# Patient Record
Sex: Female | Born: 1975 | Race: Black or African American | Hispanic: No | Marital: Single | Smoking: Current every day smoker
Health system: Southern US, Community
[De-identification: ages and names within clinical notes are randomized; demographics above are authoritative.]

---

## 1997-12-30 ENCOUNTER — Other Ambulatory Visit: Admission: RE | Admit: 1997-12-30 | Discharge: 1997-12-30 | Payer: Self-pay | Admitting: Obstetrics and Gynecology

## 1998-02-05 ENCOUNTER — Emergency Department (HOSPITAL_COMMUNITY): Admission: EM | Admit: 1998-02-05 | Discharge: 1998-02-06 | Payer: Self-pay | Admitting: Emergency Medicine

## 1998-03-10 ENCOUNTER — Other Ambulatory Visit: Admission: RE | Admit: 1998-03-10 | Discharge: 1998-03-10 | Payer: Self-pay

## 1998-12-24 ENCOUNTER — Emergency Department (HOSPITAL_COMMUNITY): Admission: EM | Admit: 1998-12-24 | Discharge: 1998-12-24 | Payer: Self-pay | Admitting: Emergency Medicine

## 1998-12-25 ENCOUNTER — Ambulatory Visit (HOSPITAL_COMMUNITY): Admission: RE | Admit: 1998-12-25 | Discharge: 1998-12-25 | Payer: Self-pay | Admitting: Ophthalmology

## 2002-04-08 ENCOUNTER — Emergency Department (HOSPITAL_COMMUNITY): Admission: EM | Admit: 2002-04-08 | Discharge: 2002-04-09 | Payer: Self-pay | Admitting: Emergency Medicine

## 2005-01-31 ENCOUNTER — Emergency Department (HOSPITAL_COMMUNITY): Admission: EM | Admit: 2005-01-31 | Discharge: 2005-01-31 | Payer: Self-pay | Admitting: Family Medicine

## 2005-10-09 ENCOUNTER — Emergency Department (HOSPITAL_COMMUNITY): Admission: EM | Admit: 2005-10-09 | Discharge: 2005-10-09 | Payer: Self-pay | Admitting: Family Medicine

## 2006-01-20 ENCOUNTER — Emergency Department (HOSPITAL_COMMUNITY): Admission: EM | Admit: 2006-01-20 | Discharge: 2006-01-21 | Payer: Self-pay | Admitting: Emergency Medicine

## 2007-06-03 ENCOUNTER — Emergency Department (HOSPITAL_COMMUNITY): Admission: EM | Admit: 2007-06-03 | Discharge: 2007-06-03 | Payer: Self-pay | Admitting: Emergency Medicine

## 2007-10-16 ENCOUNTER — Emergency Department (HOSPITAL_COMMUNITY): Admission: EM | Admit: 2007-10-16 | Discharge: 2007-10-16 | Payer: Self-pay | Admitting: Emergency Medicine

## 2008-04-07 ENCOUNTER — Ambulatory Visit (HOSPITAL_COMMUNITY): Admission: RE | Admit: 2008-04-07 | Discharge: 2008-04-07 | Payer: Self-pay | Admitting: *Deleted

## 2008-12-03 IMAGING — US US ABDOMEN COMPLETE
1 series · 14 of 25 positions shown · non-contrast
Comparison: No comparison.

CLINICAL DATA: Upper abdominal pain, nausea, and vomiting. 
 ABDOMEN ULTRASOUND:
TECHNIQUE: Complete abdominal ultrasound examination was performed including evaluation of the liver, gallbladder, bile ducts, pancreas, kidneys, spleen, IVC, and abdominal aorta.

[Series 1: unknown · 0.38mm/px · 14 of 53 slices shown]
[im 1/53]
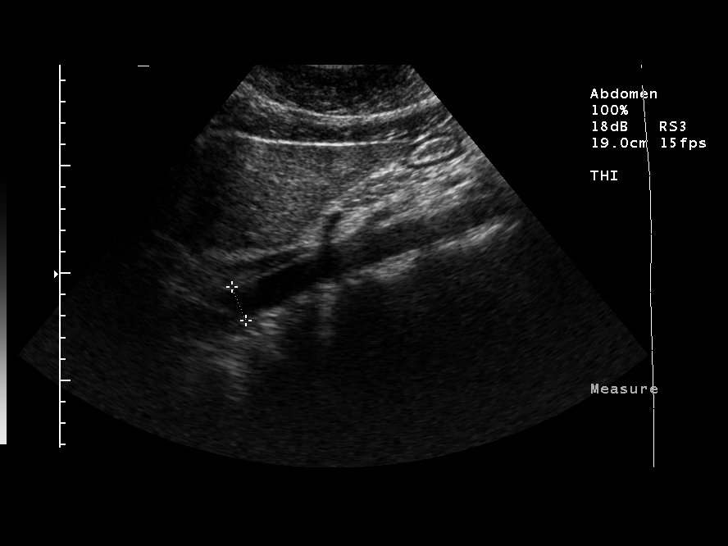
[im 5/53]
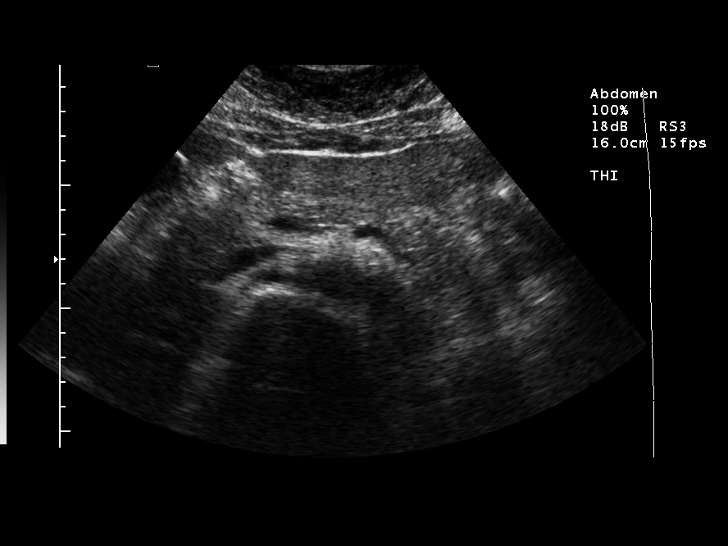
[im 9/53]
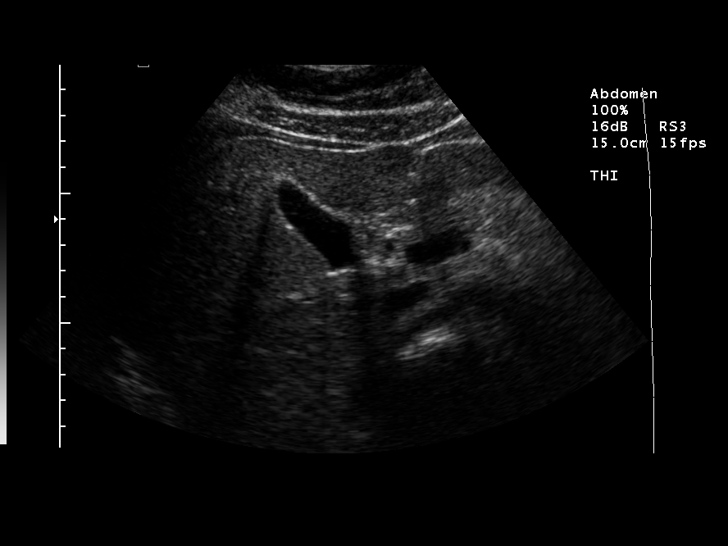
[im 14/53]
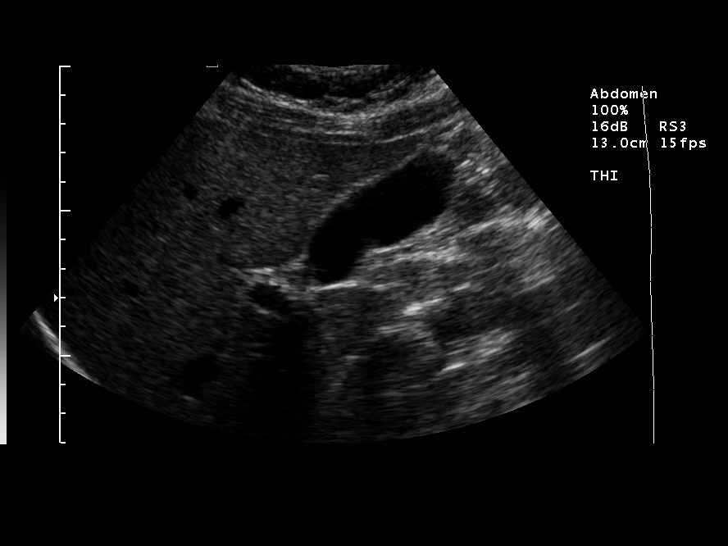
[im 18/53]
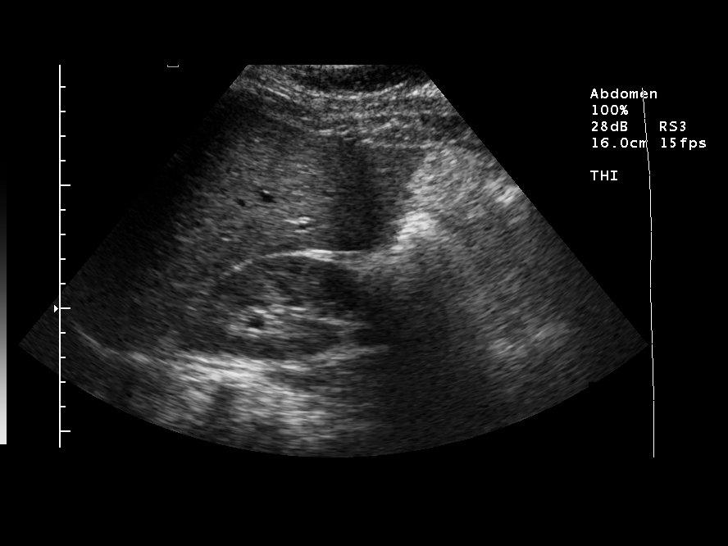
[im 20/53]
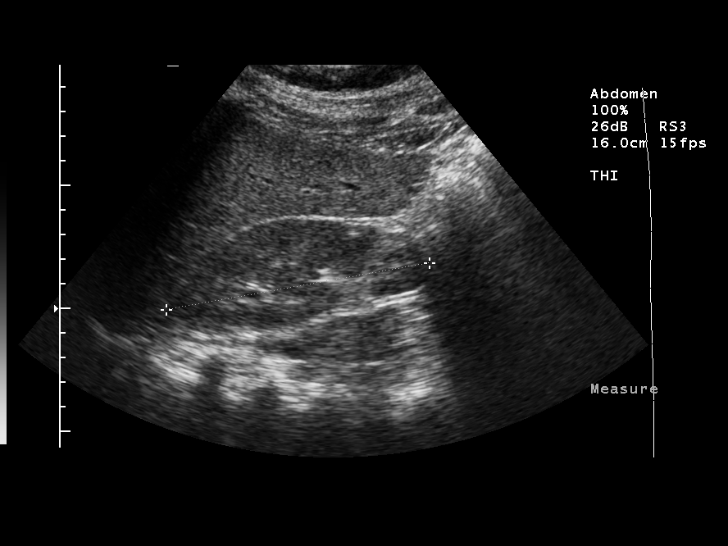
[im 24/53]
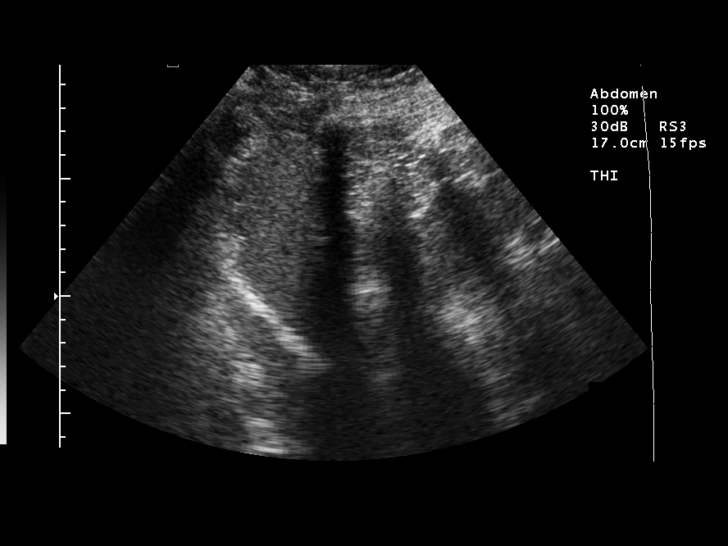
[im 29/53]
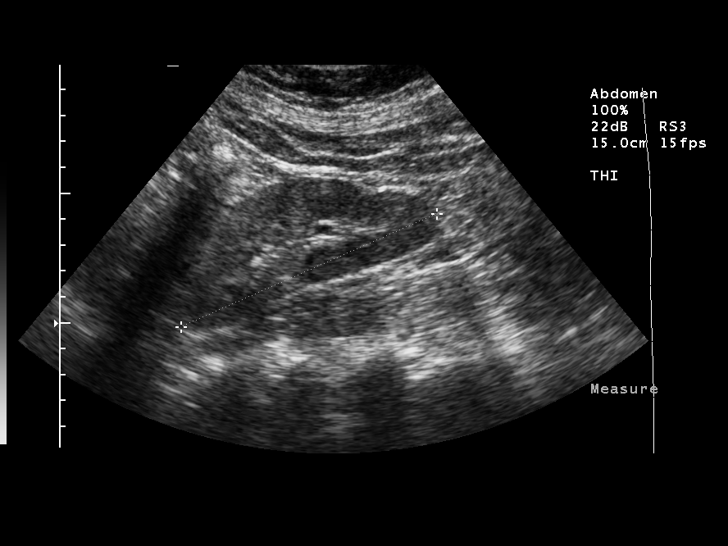
[im 33/53]
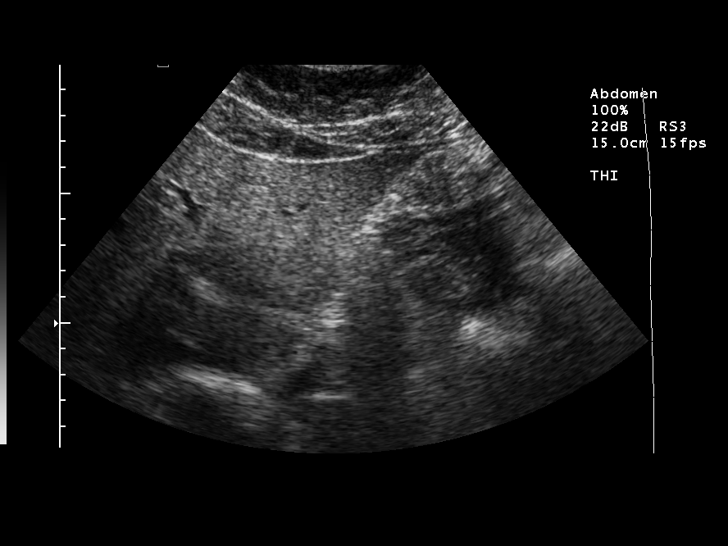
[im 35/53]
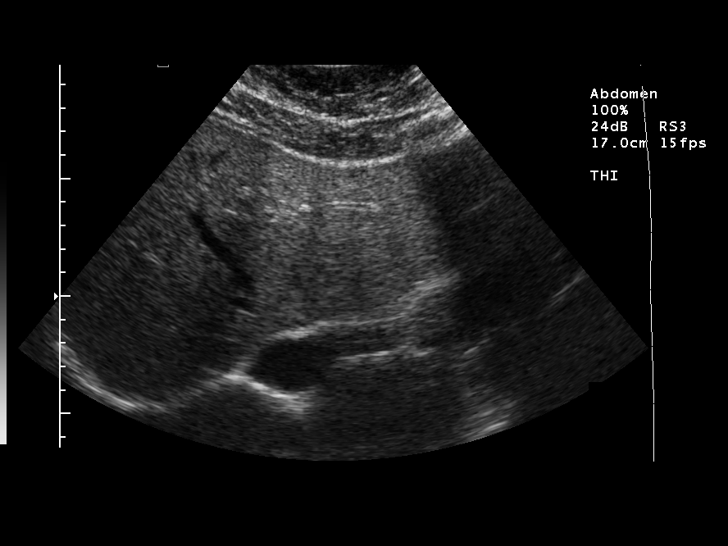
[im 40/53]
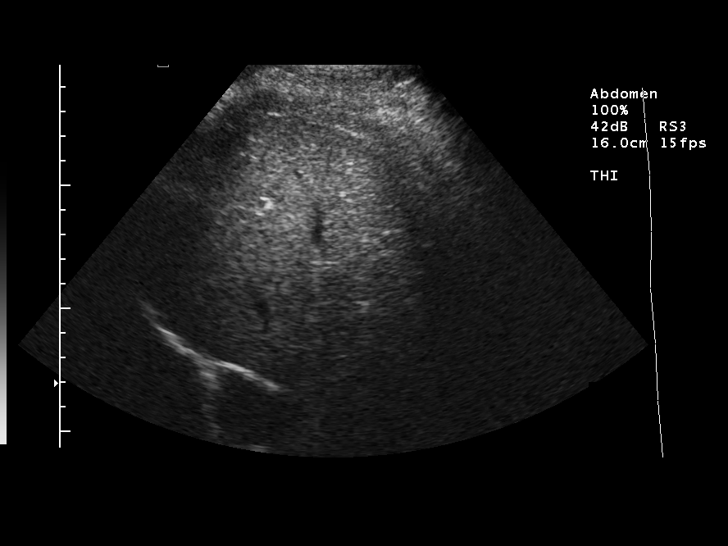
[im 44/53]
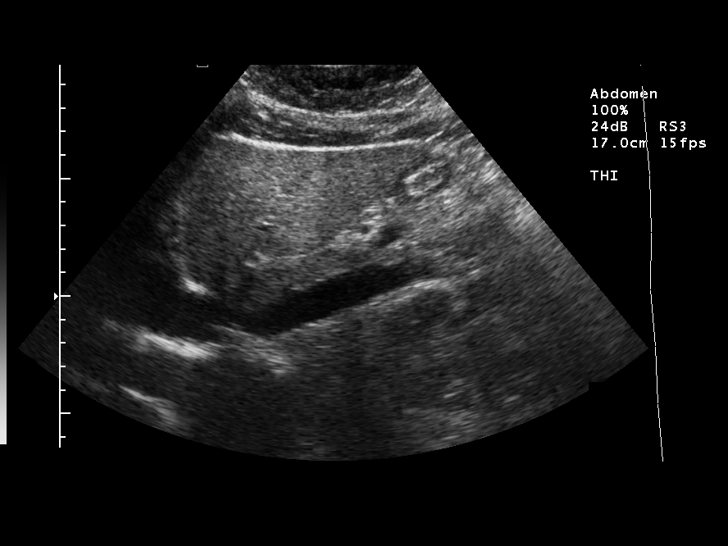
[im 48/53]
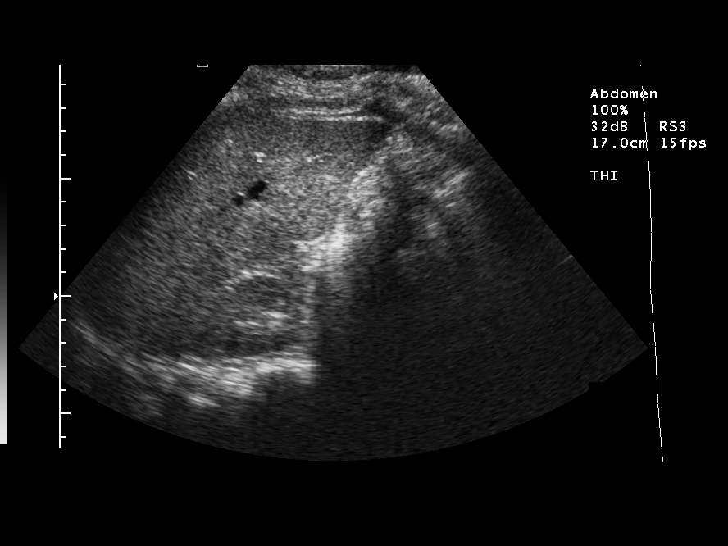
[im 53/53]
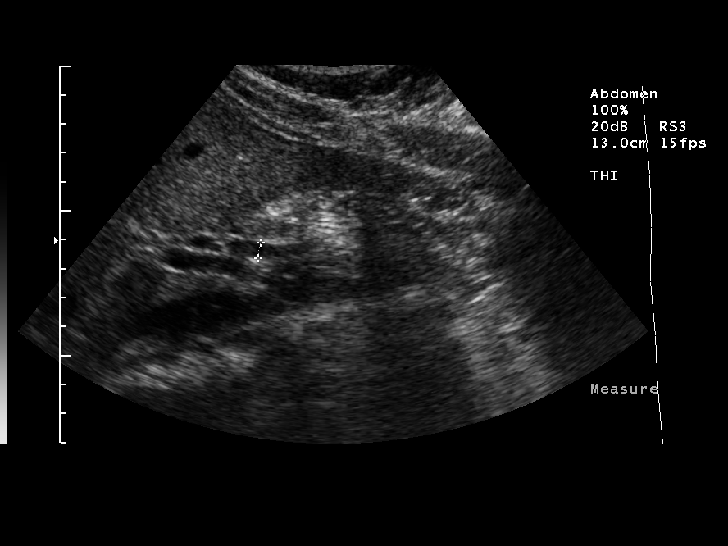

[14 of 25 positions shown; findings below may reference images not displayed]

FINDINGS: There is no evidence of gallstones or biliary ductal dilatation.  The liver is within normal limits in echogenicity, and no focal liver lesions are seen.  The visualized portions of the IVC and pancreas are unremarkable.
 There is no evidence of splenomegaly.  The kidneys are unremarkable, and there is no evidence of hydronephrosis. Both kidney lengths are 10.8 cm.   The abdominal aorta is non-dilated.
IMPRESSION: Negative abdominal ultrasound.

## 2011-01-11 NOTE — Op Note (Signed)
Tami Rogers, Tami Rogers             ACCOUNT NO.:  [id number]   MEDICAL RECORD NO.:  [id number]          PATIENT TYPE:  AMB   LOCATION:  ENDO                         FACILITY:  [facility]   PHYSICIAN:  Georgiana Spinner, M.D.    DATE OF BIRTH:  1976/05/20   DATE OF PROCEDURE:  04/07/2008  DATE OF DISCHARGE:                               OPERATIVE REPORT   PROCEDURE:  Upper endoscopy   INDICATIONS:  GERD.   ANESTHESIA:  Fentanyl 100 mcg, Versed 10 mg.   PROCEDURE:  With the patient mildly sedated in the left lateral  decubitus position, the Pentax videoscopic endoscope was inserted in the  mouth and passed under direct vision through the esophagus which  appeared normal, into the stomach through a widely patulous patent lower  esophageal sphincter.  In fact, there appeared to be no evidence of  sphincter at all.  We entered into the stomach; fundus, body, antrum,  duodenal bulb, second portion duodenum appeared normal.  From this  point, the endoscope was slowly withdrawn, taking circumferential views  of duodenal mucosa until the endoscope had been pulled back into the  stomach and placed in retroflexion to view the stomach from below.  The  endoscope was straightened and withdrawn, taking circumferential views  of the remaining gastric and esophageal mucosa.  The patient's vital  signs and pulse oximeter remained stable.  The patient tolerated the  procedure well without apparent complications.   FINDINGS:  Widely patent lower esophageal sphincter indicating free  reflux.   PLAN:  Will have patient follow-up with me as an outpatient and proceed  to colonoscopy.           ______________________________  Georgiana Spinner, M.D.     GMO/MEDQ  D:  04/07/2008  T:  04/07/2008  Job:  [id number]

## 2011-01-11 NOTE — Op Note (Signed)
Tami Rogers, Tami Rogers             ACCOUNT NO.:  [id number]   MEDICAL RECORD NO.:  [id number]          PATIENT TYPE:  AMB   LOCATION:  ENDO                         FACILITY:  [facility]   PHYSICIAN:  Georgiana Spinner, M.D.    DATE OF BIRTH:  1976-03-09   DATE OF PROCEDURE:  04/07/2008  DATE OF DISCHARGE:                               OPERATIVE REPORT   PROCEDURE:  Colonoscopy.   INDICATIONS:  Rectal bleeding.   ANESTHESIA:  1. Fentanyl 50 mcg.  2. Versed 4 mg.   PROCEDURE:  With the patient mildly sedated in the left lateral  decubitus position, the Pentax videoscopic colonoscope was inserted in  the rectum and passed under direct vision.  With pressure applied and  the patient rolled to the back, we were able to reach the cecum,  identified by the ileocecal valve and appendiceal orifice, both of which  were photographed.  From this point, the colonoscope was slowly  withdrawn, taking circumferential views of the colonic mucosa, stopping  in the rectum, which appeared normal on direct and showed a fissure on  retroflexed view.  The endoscope was straightened and withdrawn through  the anal canal, which otherwise appeared normal.  The patient's vital  signs and pulse oximeter remained stable.  The patient tolerated the  procedure well, without apparent complications.   FINDINGS:  Rectal fissure noted.  Otherwise, an unremarkable  examination.   PLAN:  Have the patient follow up with me as noted above.           ______________________________  Georgiana Spinner, M.D.     GMO/MEDQ  D:  04/07/2008  T:  04/07/2008  Job:  [id number]

## 2011-03-21 ENCOUNTER — Inpatient Hospital Stay (INDEPENDENT_AMBULATORY_CARE_PROVIDER_SITE_OTHER)
Admission: RE | Admit: 2011-03-21 | Discharge: 2011-03-21 | Disposition: A | Discharge status: Home / Self Care | Payer: Self-pay | Source: Ambulatory Visit | Attending: Family Medicine | Admitting: Family Medicine

## 2011-03-21 DIAGNOSIS — N943 Premenstrual tension syndrome: Secondary | ICD-10-CM

## 2011-03-21 LAB — POCT URINALYSIS DIP (DEVICE)
Glucose, UA: NEGATIVE mg/dL
Nitrite: NEGATIVE
Urobilinogen, UA: 0.2 mg/dL (ref 0.0–1.0)

## 2011-03-21 LAB — POCT PREGNANCY, URINE: Preg Test, Ur: NEGATIVE

## 2011-05-20 LAB — POCT RAPID STREP A: Streptococcus, Group A Screen (Direct): NEGATIVE

## 2011-06-09 LAB — HEPATIC FUNCTION PANEL
Albumin: 4.2
Alkaline Phosphatase: 48
Bilirubin, Direct: <0.1
Total Bilirubin: 0.6
Total Protein: 7.3

## 2011-06-09 LAB — DIFFERENTIAL
Basophils Absolute: 0
Lymphs Abs: 2.9
Monocytes Absolute: 0.7
Monocytes Relative: 6
Neutro Abs: 8 — ABNORMAL HIGH
Neutrophils Relative %: 66

## 2011-06-09 LAB — URINALYSIS, ROUTINE W REFLEX MICROSCOPIC
Specific Gravity, Urine: 1.02
pH: 5.5

## 2011-06-09 LAB — I-STAT 8, (EC8 V) (CONVERTED LAB)
BUN: 4 — ABNORMAL LOW
Glucose, Bld: 105 — ABNORMAL HIGH
HCT: 46
Hemoglobin: 15.6 — ABNORMAL HIGH
Operator id: 196461
Potassium: 3.5
Sodium: 140

## 2011-06-09 LAB — CBC
Hemoglobin: 13.9
MCHC: 34.2
Platelets: 273
RDW: 12.5

## 2011-06-09 LAB — AMYLASE: Amylase: 127

## 2011-06-09 LAB — POCT I-STAT CREATININE: Creatinine, Ser: 1

## 2011-06-09 LAB — POCT PREGNANCY, URINE
Operator id: 196461
Preg Test, Ur: NEGATIVE

## 2012-02-09 ENCOUNTER — Emergency Department (INDEPENDENT_AMBULATORY_CARE_PROVIDER_SITE_OTHER)
Admission: EM | Admit: 2012-02-09 | Discharge: 2012-02-09 | Disposition: A | Discharge status: Home / Self Care | Payer: Self-pay | Source: Home / Self Care | Attending: Emergency Medicine | Admitting: Emergency Medicine

## 2012-02-09 ENCOUNTER — Encounter (HOSPITAL_COMMUNITY): Payer: Self-pay | Admitting: Emergency Medicine

## 2012-02-09 DIAGNOSIS — J45909 Unspecified asthma, uncomplicated: Secondary | ICD-10-CM

## 2012-02-09 DIAGNOSIS — J40 Bronchitis, not specified as acute or chronic: Secondary | ICD-10-CM

## 2012-02-09 MED ORDER — PREDNISONE 20 MG PO TABS: 40.0000 mg | ORAL_TABLET | Freq: Every day | ORAL | Status: AC

## 2012-02-09 MED ORDER — AZITHROMYCIN 250 MG PO TABS: 250.0000 mg | ORAL_TABLET | Freq: Every day | ORAL | Status: AC

## 2012-02-09 MED ORDER — ALBUTEROL SULFATE HFA 108 (90 BASE) MCG/ACT IN AERS: 1.0000 | INHALATION_SPRAY | Freq: Four times a day (QID) | RESPIRATORY_TRACT | Status: DC | PRN

## 2012-02-09 NOTE — ED Notes (Signed)
Pt here with c/o URI THAT STARTED X 2 WEEKS AGO WITH SORE THROAT,DRY COUGH AND FEVERS WHICH HAS RESOLVED.STATES COUGH HAS WORSENED WITH CONGESTION AND SOB ON EXERTION.SATS 97% R/A.LUNGS CLEAR.MUCINEX NOT WORKING

## 2012-02-09 NOTE — ED Provider Notes (Addendum)
History     CSN: [id number]  Arrival date & time 02/09/12  1137   First MD Initiated Contact with Patient 02/09/12 1202      Chief Complaint  Patient presents with  . URI  . Shortness of Breath    (Consider location/radiation/quality/duration/timing/severity/associated sxs/prior treatment) HPI Comments: Patient presents to urgent care today after having been waiting for her respiratory symptoms to subside but they haven't. Patient started about 2 weeks ago with a sore throat and upper congestion describe a stuffy and runny nose. Had fevers initially although during this week have not perceive any fevers. She has been coughing initially a dry and nonproductive cough during the last few days have been noticing that her chest is congested and she's having productive cough of yellow to green sputum. Patient feels that she's not breathing well as she gets tired easily. Main symptoms are at night where she seemed to be coughing more frequently. Try some over-the-counter decongestants but with no real help.  Patient is a 36 y.o. female presenting with URI and shortness of breath. The history is provided by the patient.  URI The primary symptoms include fever, sore throat and cough. Primary symptoms do not include wheezing, abdominal pain, nausea, myalgias, arthralgias or rash. The current episode started more than 1 week ago. This is a recurrent problem.  The sore throat is not accompanied by stridor.  Symptoms associated with the illness include chills, sinus pressure, congestion and rhinorrhea.  Shortness of Breath  Associated symptoms include a fever, rhinorrhea, sore throat, cough and shortness of breath. Pertinent negatives include no chest pain, no stridor and no wheezing.    History reviewed. No pertinent past medical history.  History reviewed. No pertinent past surgical history.  No family history on file.  History  Substance Use Topics  . Smoking status: Current Everyday Smoker    . Smokeless tobacco: Not on file  . Alcohol Use: No    OB History    Grav Para Term Preterm Abortions TAB SAB Ect Mult Living                  Review of Systems  Constitutional: Positive for fever, chills and activity change.  HENT: Positive for congestion, sore throat, rhinorrhea and sinus pressure.   Respiratory: Positive for cough and shortness of breath. Negative for chest tightness, wheezing and stridor.   Cardiovascular: Negative for chest pain, palpitations and leg swelling.  Gastrointestinal: Negative for nausea and abdominal pain.  Musculoskeletal: Negative for myalgias and arthralgias.  Skin: Negative for rash.  Neurological: Negative for dizziness.    Allergies  Review of patient's allergies indicates no known allergies.  Home Medications   Current Outpatient Rx  Name Route Sig Dispense Refill  . ALBUTEROL SULFATE HFA 108 (90 BASE) MCG/ACT IN AERS Inhalation Inhale 1-2 puffs into the lungs every 6 (six) hours as needed for wheezing. 1 Inhaler 0  . AZITHROMYCIN 250 MG PO TABS Oral Take 1 tablet (250 mg total) by mouth daily. Take first 2 tablets together, then 1 every day until finished. 6 tablet 0  . PREDNISONE 20 MG PO TABS Oral Take 2 tablets (40 mg total) by mouth daily. 2 tablets daily for 5 days 10 tablet 0    BP 118/63  Pulse 87  Temp 98.4 F (36.9 C) (Oral)  Resp 16  SpO2 98%  LMP 01/20/2012  Physical Exam  Nursing note and vitals reviewed. Constitutional: She appears well-developed and well-nourished.  Non-toxic appearance. She does not  have a sickly appearance. She does not appear ill. No distress. She is not intubated.    HENT:  Head: Normocephalic.  Eyes: Conjunctivae are normal. Right eye exhibits no discharge. Left eye exhibits no discharge.  Neck: Neck supple. No JVD present.  Cardiovascular: Normal rate.  Exam reveals no gallop.   No murmur heard. Pulmonary/Chest: Effort normal. No accessory muscle usage. No apnea, not tachypneic and not  bradypneic. She is not intubated. No respiratory distress. She has decreased breath sounds. She has no wheezes. She has no rhonchi. She has no rales. She exhibits no tenderness.  Lymphadenopathy:    She has no cervical adenopathy.    ED Course  Procedures (including critical care time)  Labs Reviewed - No data to display No results found.   1. Bronchitis   2. Reactive airway disease       MDM  Patient with respiratory symptoms for 2 weeks. On exam noted is prolonged expiration without active wheezing. Afebrile. Patient has some degree of reactive airway disease with bronchitis we'll treat patient with an antibiotic cycle prednisone and albuterol encourage her to return if no improvement is noted after 5-7 days for a second lung exam and perhaps x-rays if necessary. Patient agree with treatment plan and followup care as necessary.        Jimmie Molly, MD 02/09/12 1302  Jimmie Molly, MD 02/09/12 [phone]

## 2013-01-05 ENCOUNTER — Emergency Department (HOSPITAL_COMMUNITY)
Admission: EM | Admit: 2013-01-05 | Discharge: 2013-01-05 | Disposition: A | Discharge status: Home / Self Care | Payer: BC Managed Care – PPO | Source: Home / Self Care | Attending: Emergency Medicine | Admitting: Emergency Medicine

## 2013-01-05 ENCOUNTER — Encounter (HOSPITAL_COMMUNITY): Payer: Self-pay | Admitting: Emergency Medicine

## 2013-01-05 DIAGNOSIS — J209 Acute bronchitis, unspecified: Secondary | ICD-10-CM

## 2013-01-05 MED ORDER — AMOXICILLIN-POT CLAVULANATE 875-125 MG PO TABS: 1.0000 | ORAL_TABLET | Freq: Two times a day (BID) | ORAL | Status: DC

## 2013-01-05 MED ORDER — PREDNISONE 20 MG PO TABS: 20.0000 mg | ORAL_TABLET | Freq: Two times a day (BID) | ORAL | Status: DC

## 2013-01-05 MED ORDER — ALBUTEROL SULFATE HFA 108 (90 BASE) MCG/ACT IN AERS: 1.0000 | INHALATION_SPRAY | Freq: Four times a day (QID) | RESPIRATORY_TRACT | Status: DC | PRN

## 2013-01-05 MED ORDER — HYDROCOD POLST-CHLORPHEN POLST 10-8 MG/5ML PO LQCR: 5.0000 mL | Freq: Two times a day (BID) | ORAL | Status: DC | PRN

## 2013-01-05 NOTE — ED Notes (Signed)
Pt c/o cold sx onset Sunday Sx include: productive cough, sore throat, nasal congestion, loss of voice, SOB, wheezing, chest pain due cough Denies: f/v/n/d; no hx of asthma  She is alert and oriented w/no signs of acute distress.

## 2013-01-05 NOTE — ED Provider Notes (Signed)
Chief Complaint:   Chief Complaint  Patient presents with  . URI    History of Present Illness:   Tami Rogers is a 37 year old female who has had a one-week history of sore throat, cough productive of bloody green drainage, wheezing, shortness of breath, chest pain, fever, chills, nasal congestion with green drainage, and headache. She denies any GI symptoms. She has had no sick exposures.  Review of Systems:  Other than noted above, the patient denies any of the following symptoms: Systemic:  No fevers, chills, sweats, weight loss or gain, fatigue, or tiredness. Eye:  No redness or discharge. ENT:  No ear pain, drainage, headache, nasal congestion, drainage, sinus pressure, difficulty swallowing, or sore throat. Neck:  No neck pain or swollen glands. Lungs:  No cough, sputum production, hemoptysis, wheezing, chest tightness, shortness of breath or chest pain. GI:  No abdominal pain, nausea, vomiting or diarrhea.  PMFSH:  Past medical history, family history, social history, meds, and allergies were reviewed.  Physical Exam:   Vital signs:  BP 128/69  Pulse 93  Temp(Src) 98.8 F (37.1 C) (Oral)  Resp 16  SpO2 100%  LMP 12/03/2012 General:  Alert and oriented.  In no distress.  Skin warm and dry. Eye:  No conjunctival injection or drainage. Lids were normal. ENT:  TMs and canals were normal, without erythema or inflammation.  Nasal mucosa was clear and uncongested, without drainage.  Mucous membranes were moist.  Pharynx was clear with no exudate or drainage.  There were no oral ulcerations or lesions. Neck:  Supple, no adenopathy, tenderness or mass. Lungs:  No respiratory distress.  Lungs were clear to auscultation, without wheezes, rales or rhonchi.  Breath sounds were clear and equal bilaterally.  Heart:  Regular rhythm, without gallops, murmers or rubs. Skin:  Clear, warm, and dry, without rash or lesions.  Assessment:  The encounter diagnosis was Acute  bronchitis.  Plan:   1.  The following meds were prescribed:   Discharge Medication List as of 01/05/2013 11:49 AM    START taking these medications   Details  !! albuterol (PROVENTIL HFA;VENTOLIN HFA) 108 (90 BASE) MCG/ACT inhaler Inhale 1-2 puffs into the lungs every 6 (six) hours as needed for wheezing., Starting 01/05/2013, Until Discontinued, Normal    amoxicillin-clavulanate (AUGMENTIN) 875-125 MG per tablet Take 1 tablet by mouth 2 (two) times daily., Starting 01/05/2013, Until Discontinued, Normal    chlorpheniramine-HYDROcodone (TUSSIONEX) 10-8 MG/5ML LQCR Take 5 mLs by mouth every 12 (twelve) hours as needed., Starting 01/05/2013, Until Discontinued, Normal    predniSONE (DELTASONE) 20 MG tablet Take 1 tablet (20 mg total) by mouth 2 (two) times daily., Starting 01/05/2013, Until Discontinued, Normal     !! - Potential duplicate medications found. Please discuss with provider.     2.  The patient was instructed in symptomatic care and handouts were given. 3.  The patient was told to return if becoming worse in any way, if no better in 7 days, and given some red flag symptoms such as fever, difficulty breathing, or increasing pain that would indicate earlier return. 4.  Follow up  Here in 7 days if no improvement.      Reuben Likes, MD 01/05/13 [phone]

## 2013-05-30 ENCOUNTER — Ambulatory Visit: Payer: BC Managed Care – PPO | Admitting: Emergency Medicine

## 2013-05-30 VITALS — BP 142/88 | HR 83 | Temp 98.7°F | Resp 20 | Ht 68.25 in | Wt 237.2 lb

## 2013-05-30 DIAGNOSIS — G43109 Migraine with aura, not intractable, without status migrainosus: Secondary | ICD-10-CM

## 2013-05-30 DIAGNOSIS — R609 Edema, unspecified: Secondary | ICD-10-CM

## 2013-05-30 LAB — POCT CBC
Granulocyte percent: 45 %G (ref 37–80)
HCT, POC: 40.1 % (ref 37.7–47.9)
Hemoglobin: 12.7 g/dL (ref 12.2–16.2)
MID (cbc): 0.7 (ref 0–0.9)
MPV: 9.2 fL (ref 0–99.8)
POC Granulocyte: 3.5 (ref 2–6.9)
POC LYMPH PERCENT: 46.2 %L (ref 10–50)
POC MID %: 8.8 %M (ref 0–12)
RDW, POC: 13.2 %

## 2013-05-30 LAB — COMPREHENSIVE METABOLIC PANEL
AST: 17 U/L (ref 0–37)
Alkaline Phosphatase: 46 U/L (ref 39–117)
BUN: 7 mg/dL (ref 6–23)
CO2: 26 mEq/L (ref 19–32)
Calcium: 9.4 mg/dL (ref 8.4–10.5)
Chloride: 105 mEq/L (ref 96–112)
Creat: 0.89 mg/dL (ref 0.50–1.10)
Glucose, Bld: 78 mg/dL (ref 70–99)
Sodium: 138 mEq/L (ref 135–145)
Total Protein: 6.8 g/dL (ref 6.0–8.3)

## 2013-05-30 MED ORDER — RIZATRIPTAN BENZOATE 10 MG PO TBDP: 10.0000 mg | ORAL_TABLET | ORAL | Status: DC | PRN

## 2013-05-30 MED ORDER — HYDROCHLOROTHIAZIDE 25 MG PO TABS: 25.0000 mg | ORAL_TABLET | Freq: Every day | ORAL | Status: DC

## 2013-05-30 NOTE — Progress Notes (Signed)
[facility] [address] [place] [address] [phone]  Date:  05/30/2013   Name:  Tami Rogers   DOB:  1975-11-07   MRN:  [id number]  PCP:  No PCP Per Patient    Chief Complaint: Edema and Migraine   History of Present Illness:  Tami Rogers is a 37 y.o. very pleasant female patient who presents with the following:  Describes premenstrual headache that starts on the left temple and spreads.  Associated with a constant pounding headache, photophobia and nausea.  Not relieved with any OTC meds.  Never evaluated for headaches.  No fever or chills.  No neuro or visual symptoms.  No difficulty with speech, balance, coordination or weakness.  No antecedent illness or injury.  Headaches for past 2 years.  No improvement with over the counter medications or other home remedies.  Has noted intermittent foot and ankle swelling over the past three months.  No travel, immobilization.  Not on OCP.  There are no active problems to display for this patient.   History reviewed. No pertinent past medical history.  History reviewed. No pertinent past surgical history.  History  Substance Use Topics  . Smoking status: Current Every Day Smoker -- 0.50 packs/day    Types: Cigarettes  . Smokeless tobacco: Not on file  . Alcohol Use: No    Family History  Problem Relation Age of Onset  . Hypertension Mother   . Diabetes Mother   . Diabetes Maternal Grandmother   . Hypertension Maternal Grandmother     No Known Allergies  Medication list has been reviewed and updated.  Current Outpatient Prescriptions on File Prior to Visit  Medication Sig Dispense Refill  . albuterol (PROVENTIL HFA;VENTOLIN HFA) 108 (90 BASE) MCG/ACT inhaler Inhale 1-2 puffs into the lungs every 6 (six) hours as needed for wheezing.  1 Inhaler  0  . albuterol (PROVENTIL HFA;VENTOLIN HFA) 108 (90 BASE) MCG/ACT inhaler Inhale 1-2 puffs into the lungs every 6 (six) hours as needed for  wheezing.  1 Inhaler  0  . amoxicillin-clavulanate (AUGMENTIN) 875-125 MG per tablet Take 1 tablet by mouth 2 (two) times daily.  20 tablet  0  . chlorpheniramine-HYDROcodone (TUSSIONEX) 10-8 MG/5ML LQCR Take 5 mLs by mouth every 12 (twelve) hours as needed.  140 mL  0  . predniSONE (DELTASONE) 20 MG tablet Take 1 tablet (20 mg total) by mouth 2 (two) times daily.  10 tablet  0   No current facility-administered medications on file prior to visit.    Review of Systems:  As per HPI, otherwise negative.    Physical Examination: Filed Vitals:   05/30/13 1356  BP: 142/88  Pulse: 83  Temp: 98.7 F (37.1 C)  Resp: 20   Filed Vitals:   05/30/13 1356  Height: 5' 8.25" (1.734 m)  Weight: 237 lb 3.2 oz (107.593 kg)   Body mass index is 35.78 kg/(m^2). Ideal Body Weight: Weight in (lb) to have BMI = 25: 165.3  GEN: WDWN, NAD, Non-toxic, A & O x 3 HEENT: Atraumatic, Normocephalic. Neck supple. No masses, No LAD. Ears and Nose: No external deformity. CV: RRR, No M/G/R. No JVD. No thrill. No extra heart sounds. PULM: CTA B, no wheezes, crackles, rhonchi. No retractions. No resp. distress. No accessory muscle use. ABD: S, NT, ND, +BS. No rebound. No HSM. EXTR: No c/c/e NEURO Normal gait.  PSYCH: Normally interactive. Conversant. Not depressed or anxious appearing.  Calm demeanor.  Assessment and Plan: Migraine maxalt Peripheral edema HCTZ Labs   Signed,  Phillips Odor, MD

## 2013-05-30 NOTE — Patient Instructions (Addendum)
Migraine Headache A migraine headache is an intense, throbbing pain on one or both sides of your head. A migraine can last for 30 minutes to several hours. CAUSES  The exact cause of a migraine headache is not always known. However, a migraine may be caused when nerves in the brain become irritated and release chemicals that cause inflammation. This causes pain. SYMPTOMS  Pain on one or both sides of your head.  Pulsating or throbbing pain.  Severe pain that prevents daily activities.  Pain that is aggravated by any physical activity.  Nausea, vomiting, or both.  Dizziness.  Pain with exposure to bright lights, loud noises, or activity.  General sensitivity to bright lights, loud noises, or smells. Before you get a migraine, you may get warning signs that a migraine is coming (aura). An aura may include:  Seeing flashing lights.  Seeing bright spots, halos, or zig-zag lines.  Having tunnel vision or blurred vision.  Having feelings of numbness or tingling.  Having trouble talking.  Having muscle weakness. MIGRAINE TRIGGERS  Alcohol.  Smoking.  Stress.  Menstruation.  Aged cheeses.  Foods or drinks that contain nitrates, glutamate, aspartame, or tyramine.  Lack of sleep.  Chocolate.  Caffeine.  Hunger.  Physical exertion.  Fatigue.  Medicines used to treat chest pain (nitroglycerine), birth control pills, estrogen, and some blood pressure medicines. DIAGNOSIS  A migraine headache is often diagnosed based on:  Symptoms.  Physical examination.  A CT scan or MRI of your head. TREATMENT Medicines may be given for pain and nausea. Medicines can also be given to help prevent recurrent migraines.  HOME CARE INSTRUCTIONS  Only take over-the-counter or prescription medicines for pain or discomfort as directed by your caregiver. The use of long-term narcotics is not recommended.  Lie down in a dark, quiet room when you have a migraine.  Keep a journal  to find out what may trigger your migraine headaches. For example, write down:  What you eat and drink.  How much sleep you get.  Any change to your diet or medicines.  Limit alcohol consumption.  Quit smoking if you smoke.  Get 7 to 9 hours of sleep, or as recommended by your caregiver.  Limit stress.  Keep lights dim if bright lights bother you and make your migraines worse. SEEK IMMEDIATE MEDICAL CARE IF:   Your migraine becomes severe.  You have a fever.  You have a stiff neck.  You have vision loss.  You have muscular weakness or loss of muscle control.  You start losing your balance or have trouble walking.  You feel faint or pass out.  You have severe symptoms that are different from your first symptoms. MAKE SURE YOU:   Understand these instructions.  Will watch your condition.  Will get help right away if you are not doing well or get worse. Document Released: 08/15/2005 Document Revised: 11/07/2011 Document Reviewed: 08/05/2011 [facility] Patient Information 2014 [place], [place]. Edema Edema is an abnormal build-up of fluids in tissues. Because this is partly dependent on gravity (water flows to the lowest place), it is more common in the legs and thighs (lower extremities). It is also common in the looser tissues, like around the eyes. Painless swelling of the feet and ankles is common and increases as a person ages. It may affect both legs and may include the calves or even thighs. When squeezed, the fluid may move out of the affected area and may leave a dent for a few moments. CAUSES  Prolonged standing or sitting in one place for extended periods of time. Movement helps pump tissue fluid into the veins, and absence of movement prevents this, resulting in edema.  Varicose veins. The valves in the veins do not work as well as they should. This causes fluid to leak into the tissues.  Fluid and salt overload.  Injury, burn, or surgery to the leg, ankle,  or foot, may damage veins and allow fluid to leak out.  Sunburn damages vessels. Leaky vessels allow fluid to go out into the sunburned tissues.  Allergies (from insect bites or stings, medications or chemicals) cause swelling by allowing vessels to become leaky.  Protein in the blood helps keep fluid in your vessels. Low protein, as in malnutrition, allows fluid to leak out.  Hormonal changes, including pregnancy and menstruation, cause fluid retention. This fluid may leak out of vessels and cause edema.  Medications that cause fluid retention. Examples are sex hormones, blood pressure medications, steroid treatment, or anti-depressants.  Some illnesses cause edema, especially heart failure, kidney disease, or liver disease.  Surgery that cuts veins or lymph nodes, such as surgery done for the heart or for breast cancer, may result in edema. DIAGNOSIS  Your caregiver is usually easily able to determine what is causing your swelling (edema) by simply asking what is wrong (getting a history) and examining you (doing a physical). Sometimes x-rays, EKG (electrocardiogram or heart tracing), and blood work may be done to evaluate for underlying medical illness. TREATMENT  General treatment includes:  Leg elevation (or elevation of the affected body part).  Restriction of fluid intake.  Prevention of fluid overload.  Compression of the affected body part. Compression with elastic bandages or support stockings squeezes the tissues, preventing fluid from entering and forcing it back into the blood vessels.  Diuretics (also called water pills or fluid pills) pull fluid out of your body in the form of increased urination. These are effective in reducing the swelling, but can have side effects and must be used only under your caregiver's supervision. Diuretics are appropriate only for some types of edema. The specific treatment can be directed at any underlying causes discovered. Heart, liver, or  kidney disease should be treated appropriately. HOME CARE INSTRUCTIONS   Elevate the legs (or affected body part) above the level of the heart, while lying down.  Avoid sitting or standing still for prolonged periods of time.  Avoid putting anything directly under the knees when lying down, and do not wear constricting clothing or garters on the upper legs.  Exercising the legs causes the fluid to work back into the veins and lymphatic channels. This may help the swelling go down.  The pressure applied by elastic bandages or support stockings can help reduce ankle swelling.  A low-salt diet may help reduce fluid retention and decrease the ankle swelling.  Take any medications exactly as prescribed. SEEK MEDICAL CARE IF:  Your edema is not responding to recommended treatments. SEEK IMMEDIATE MEDICAL CARE IF:   You develop shortness of breath or chest pain.  You cannot breathe when you lay down; or if, while lying down, you have to get up and go to the window to get your breath.  You are having increasing swelling without relief from treatment.  You develop a fever over 102 F (38.9 C).  You develop pain or redness in the areas that are swollen.  Tell your caregiver right away if you have gained 3 lb/1.4 kg in 1 day or  5 lb/2.3 kg in a week. MAKE SURE YOU:   Understand these instructions.  Will watch your condition.  Will get help right away if you are not doing well or get worse. Document Released: 08/15/2005 Document Revised: 02/14/2012 Document Reviewed: 04/02/2008 [facility] Patient Information 2014 [place], [place].

## 2013-07-04 ENCOUNTER — Other Ambulatory Visit: Payer: Self-pay

## 2014-09-10 ENCOUNTER — Other Ambulatory Visit: Payer: Self-pay | Admitting: Emergency Medicine

## 2014-09-11 ENCOUNTER — Other Ambulatory Visit: Payer: Self-pay | Admitting: Emergency Medicine

## 2014-09-13 ENCOUNTER — Other Ambulatory Visit: Payer: Self-pay | Admitting: Emergency Medicine

## 2014-09-17 ENCOUNTER — Other Ambulatory Visit: Payer: Self-pay | Admitting: Emergency Medicine

## 2014-12-26 ENCOUNTER — Other Ambulatory Visit: Payer: Self-pay | Admitting: Family

## 2014-12-26 DIAGNOSIS — R221 Localized swelling, mass and lump, neck: Secondary | ICD-10-CM

## 2014-12-30 ENCOUNTER — Other Ambulatory Visit: Payer: Self-pay

## 2014-12-31 ENCOUNTER — Ambulatory Visit
Admission: RE | Admit: 2014-12-31 | Discharge: 2014-12-31 | Disposition: A | Discharge status: Home / Self Care | Payer: BLUE CROSS/BLUE SHIELD | Source: Ambulatory Visit | Attending: Family | Admitting: Family

## 2014-12-31 DIAGNOSIS — R221 Localized swelling, mass and lump, neck: Secondary | ICD-10-CM

## 2014-12-31 MED ORDER — IOPAMIDOL (ISOVUE-300) INJECTION 61%
75.0000 mL | Freq: Once | INTRAVENOUS | Status: AC | PRN
  Administered 2014-12-31: 75 mL via INTRAVENOUS

## 2015-01-27 ENCOUNTER — Ambulatory Visit: Payer: BLUE CROSS/BLUE SHIELD | Admitting: Endocrinology

## 2015-01-28 ENCOUNTER — Encounter: Payer: Self-pay | Admitting: Endocrinology

## 2015-01-28 ENCOUNTER — Other Ambulatory Visit (HOSPITAL_COMMUNITY)
Admission: RE | Admit: 2015-01-28 | Discharge: 2015-01-28 | Disposition: A | Discharge status: Home / Self Care | Payer: BLUE CROSS/BLUE SHIELD | Source: Ambulatory Visit | Attending: Endocrinology | Admitting: Endocrinology

## 2015-01-28 ENCOUNTER — Ambulatory Visit (INDEPENDENT_AMBULATORY_CARE_PROVIDER_SITE_OTHER): Payer: BLUE CROSS/BLUE SHIELD | Admitting: Endocrinology

## 2015-01-28 VITALS — BP 138/90 | HR 86 | Ht 68.25 in | Wt 241.0 lb

## 2015-01-28 DIAGNOSIS — E042 Nontoxic multinodular goiter: Secondary | ICD-10-CM | POA: Diagnosis not present

## 2015-01-28 DIAGNOSIS — E041 Nontoxic single thyroid nodule: Secondary | ICD-10-CM | POA: Diagnosis not present

## 2015-01-28 NOTE — Patient Instructions (Signed)
We'll let you know about the results of the biopsy.  Please come back for a follow-up appointment in 6 months.  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  this is usually a slow process, happening over the span of many years.

## 2015-01-28 NOTE — Progress Notes (Signed)
   Subjective:    Patient ID: Tami Rogers, female    DOB: 1976-08-29, 39 y.o.   MRN: [id number]  HPI Pt states 2 mos of slight swelling at the right anterior neck, but no assoc pain.  she has no h/o XRT or surgery to the neck.   No past medical history on file.  No past surgical history on file.  History   Social History  . Marital Status: Single    Spouse Name: N/A  . Number of Children: N/A  . Years of Education: N/A   Occupational History  . Not on file.   Social History Main Topics  . Smoking status: Current Every Day Smoker -- 0.50 packs/day    Types: Cigarettes  . Smokeless tobacco: Not on file  . Alcohol Use: No  . Drug Use: No  . Sexual Activity: Not on file   Other Topics Concern  . Not on file   Social History Narrative    No current outpatient prescriptions on file prior to visit.   No current facility-administered medications on file prior to visit.    No Known Allergies  Family History  Problem Relation Age of Onset  . Hypertension Mother   . Diabetes Mother   . Diabetes Maternal Grandmother   . Hypertension Maternal Grandmother   . Thyroid disease Maternal Grandmother     BP 138/90 mmHg  Pulse 86  Ht 5' 8.25" (1.734 m)  Wt 241 lb (109.317 kg)  BMI 36.36 kg/m2  SpO2 95%  LMP 12/02/2014  Review of Systems Pt reports slight swelling of the ankles, cold intolerance, and fatigue.      Objective:   Physical Exam VITAL SIGNS:  See vs page GENERAL: no distress Neck: moderate swelling at the right anterior neck  Radiol: i reviewed CT report outside test results are reviewed: TSH=0.9  I have reviewed outside records: pt was noted to have right neck swelling, and ref here.     thyroid needle bx: consent obtained, signed form on chart The area is first sprayed with cooling agent local: xylocaine 2%, with epinephrine prep: alcohol pad 4 bxs are done with 25G needles.  9 cc of brown fluid are aspirated. no complications      Assessment & Plan:  Multinodular goiter, cystic, new.   Patient is advised the following: Patient Instructions  We'll let you know about the results of the biopsy.  Please come back for a follow-up appointment in 6 months.  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  this is usually a slow process, happening over the span of many years.

## 2015-02-05 ENCOUNTER — Telehealth: Payer: Self-pay | Admitting: Endocrinology

## 2015-02-05 NOTE — Telephone Encounter (Signed)
please call patient: No cancer is seen--good.

## 2015-02-05 NOTE — Telephone Encounter (Signed)
Called pt and lvm advising her per Dr Ellison's message below.  

## 2015-07-30 ENCOUNTER — Ambulatory Visit: Payer: BLUE CROSS/BLUE SHIELD | Admitting: Endocrinology

## 2015-07-30 DIAGNOSIS — Z0289 Encounter for other administrative examinations: Secondary | ICD-10-CM

## 2020-01-03 ENCOUNTER — Ambulatory Visit (INDEPENDENT_AMBULATORY_CARE_PROVIDER_SITE_OTHER): Payer: No Typology Code available for payment source

## 2020-01-03 ENCOUNTER — Ambulatory Visit
Admission: EM | Admit: 2020-01-03 | Discharge: 2020-01-03 | Disposition: A | Discharge status: Home / Self Care | Payer: No Typology Code available for payment source | Attending: Emergency Medicine | Admitting: Emergency Medicine

## 2020-01-03 ENCOUNTER — Other Ambulatory Visit: Payer: Self-pay

## 2020-01-03 DIAGNOSIS — R059 Cough, unspecified: Secondary | ICD-10-CM

## 2020-01-03 DIAGNOSIS — J069 Acute upper respiratory infection, unspecified: Secondary | ICD-10-CM | POA: Diagnosis not present

## 2020-01-03 DIAGNOSIS — R05 Cough: Secondary | ICD-10-CM

## 2020-01-03 MED ORDER — FLUTICASONE PROPIONATE 50 MCG/ACT NA SUSP: 1.0000 | Freq: Every day | NASAL | 0 refills | Status: AC

## 2020-01-03 MED ORDER — AEROCHAMBER PLUS FLO-VU MEDIUM MISC: 1.0000 | Freq: Once | 0 refills | Status: AC

## 2020-01-03 MED ORDER — CETIRIZINE HCL 10 MG PO TABS: 10.0000 mg | ORAL_TABLET | Freq: Every day | ORAL | 0 refills | Status: AC

## 2020-01-03 MED ORDER — BENZONATATE 100 MG PO CAPS: 100.0000 mg | ORAL_CAPSULE | Freq: Three times a day (TID) | ORAL | 0 refills | Status: AC

## 2020-01-03 MED ORDER — PREDNISONE 20 MG PO TABS: 40.0000 mg | ORAL_TABLET | Freq: Every day | ORAL | 0 refills | Status: AC

## 2020-01-03 MED ORDER — ALBUTEROL SULFATE HFA 108 (90 BASE) MCG/ACT IN AERS
2.0000 | INHALATION_SPRAY | RESPIRATORY_TRACT | 0 refills | Status: AC | PRN
Start: 2020-01-03 — End: ?

## 2020-01-03 NOTE — ED Provider Notes (Signed)
[facility]    CSN: [id number] Arrival date & time: 01/03/20  7035      History   Chief Complaint Chief Complaint  Patient presents with  . Cough    HPI Tami Rogers is a 44 y.o. female with history of multinodular goiter presenting for cough, shortness of breath, runny nose since Monday.  Has not take anything for symptoms.  Denies history of cardiopulmonary disease.  No known sick contacts, has not received Covid vaccine.  Denies fever, arthralgias, myalgias, lower leg swelling, history of DVT.  Did have some nausea the other night without emesis, abdominal pain.  No diarrhea.  Denying active chest pain, nausea.  No lightheadedness, dizziness.  States SOP is worse with exertion.   No past medical history on file.  Patient Active Problem List   Diagnosis Date Noted  . Multinodular goiter 01/28/2015    No past surgical history on file.  OB History   No obstetric history on file.      Home Medications    Prior to Admission medications   Medication Sig Start Date End Date Taking? Authorizing Provider  albuterol (VENTOLIN HFA) 108 (90 Base) MCG/ACT inhaler Inhale 2 puffs into the lungs every 4 (four) hours as needed for wheezing or shortness of breath. 01/03/20   Hall-Potvin, [place], PA-C  benzonatate (TESSALON) 100 MG capsule Take 1 capsule (100 mg total) by mouth every 8 (eight) hours. 01/03/20   Hall-Potvin, [place], PA-C  cetirizine (ZYRTEC ALLERGY) 10 MG tablet Take 1 tablet (10 mg total) by mouth daily. 01/03/20   Hall-Potvin, [place], PA-C  fluticasone (FLONASE) 50 MCG/ACT nasal spray Place 1 spray into both nostrils daily. 01/03/20   Hall-Potvin, [place], PA-C  predniSONE (DELTASONE) 20 MG tablet Take 2 tablets (40 mg total) by mouth daily. 01/03/20   Hall-Potvin, [place], PA-C  Spacer/Aero-Holding Chambers (AEROCHAMBER PLUS FLO-VU MEDIUM) MISC 1 each by Other route once for 1 dose. 01/03/20 01/03/20  Hall-Potvin, [place], PA-C    Family History Family  History  Problem Relation Age of Onset  . Hypertension Mother   . Diabetes Mother   . Diabetes Maternal Grandmother   . Hypertension Maternal Grandmother   . Thyroid disease Maternal Grandmother     Social History Social History   Tobacco Use  . Smoking status: Current Every Day Smoker    Packs/day: 0.50    Types: Cigarettes  Substance Use Topics  . Alcohol use: No  . Drug use: No     Allergies   Patient has no known allergies.   Review of Systems As per HPI   Physical Exam Triage Vital Signs ED Triage Vitals  Enc Vitals Group     BP      Pulse      Resp      Temp      Temp src      SpO2      Weight      Height      Head Circumference      Peak Flow      Pain Score      Pain Loc      Pain Edu?      Excl. in GC?    No data found.  Updated Vital Signs BP 138/89   Pulse 87   Temp 98.6 F (37 C)   Resp 16   LMP 12/11/2019   SpO2 95%   Visual Acuity Right Eye Distance:   Left Eye Distance:   Bilateral Distance:  Right Eye Near:   Left Eye Near:    Bilateral Near:     Physical Exam Constitutional:      General: She is not in acute distress.    Appearance: She is obese. She is not ill-appearing or diaphoretic.  HENT:     Head: Normocephalic and atraumatic.     Mouth/Throat:     Mouth: Mucous membranes are moist.     Pharynx: Oropharynx is clear. No oropharyngeal exudate or posterior oropharyngeal erythema.  Eyes:     General: No scleral icterus.    Conjunctiva/sclera: Conjunctivae normal.     Pupils: Pupils are equal, round, and reactive to light.  Neck:     Comments: Trachea midline, negative JVD Cardiovascular:     Rate and Rhythm: Normal rate and regular rhythm.     Heart sounds: No murmur. No gallop.   Pulmonary:     Effort: Pulmonary effort is normal. No respiratory distress.     Breath sounds: Wheezing and rhonchi present. No rales.  Musculoskeletal:     Cervical back: Neck supple. No tenderness.  Lymphadenopathy:      Cervical: No cervical adenopathy.  Skin:    Capillary Refill: Capillary refill takes less than 2 seconds.     Coloration: Skin is not jaundiced or pale.     Findings: No rash.  Neurological:     General: No focal deficit present.     Mental Status: She is alert and oriented to person, place, and time.      UC Treatments / Results  Labs (all labs ordered are listed, but only abnormal results are displayed) Labs Reviewed  NOVEL CORONAVIRUS, NAA    EKG   Radiology DG Chest 2 View  Result Date: 01/03/2020 CLINICAL DATA:  Cough. EXAM: CHEST - 2 VIEW COMPARISON:  None. FINDINGS: The heart size and mediastinal contours are within normal limits. Both lungs are clear. The visualized skeletal structures are unremarkable. IMPRESSION: No active cardiopulmonary disease. Electronically Signed   By: Lupita Raider M.D.   On: 01/03/2020 10:25    Procedures Procedures (including critical care time)  Medications Ordered in UC Medications - No data to display  Initial Impression / Assessment and Plan / UC Course  I have reviewed the triage vital signs and the nursing notes.  Pertinent labs & imaging results that were available during my care of the patient were reviewed by me and considered in my medical decision making (see chart for details).     Patient afebrile, nontoxic, with SpO2 95%.  Chest x-ray done office, reviewed by me radiology: Both lungs are clear without consolidation, pneumothorax, effusion.  Reviewed findings with patient verbalized understanding.  Covid PCR pending.  Patient to quarantine until results are back.  We will treat supportively as outlined below.  Return precautions discussed, patient verbalized understanding and is agreeable to plan. Final Clinical Impressions(s) / UC Diagnoses   Final diagnoses:  Cough  URI with cough and congestion     Discharge Instructions     Your COVID test is pending - it is important to quarantine / isolate at home until your  results are back. If you test positive and would like further evaluation for persistent or worsening symptoms, you may schedule an E-visit or virtual (video) visit throughout the [facility] app or website.  PLEASE NOTE: If you develop severe chest pain or shortness of breath please go to the ER or call [phone] for further evaluation --> DO NOT schedule electronic or  virtual visits for this. Please call our office for further guidance / recommendations as needed.  For information about the Covid vaccine, please visit [email or web address]    ED Prescriptions    Medication Sig Dispense Auth. Provider   benzonatate (TESSALON) 100 MG capsule Take 1 capsule (100 mg total) by mouth every 8 (eight) hours. 21 capsule Hall-Potvin, [place], PA-C   predniSONE (DELTASONE) 20 MG tablet Take 2 tablets (40 mg total) by mouth daily. 5 tablet Hall-Potvin, [place], PA-C   cetirizine (ZYRTEC ALLERGY) 10 MG tablet Take 1 tablet (10 mg total) by mouth daily. 30 tablet Hall-Potvin, [place], PA-C   fluticasone (FLONASE) 50 MCG/ACT nasal spray Place 1 spray into both nostrils daily. 16 g Hall-Potvin, [place], PA-C   albuterol (VENTOLIN HFA) 108 (90 Base) MCG/ACT inhaler Inhale 2 puffs into the lungs every 4 (four) hours as needed for wheezing or shortness of breath. 18 g Hall-Potvin, [place], PA-C   Spacer/Aero-Holding Chambers (AEROCHAMBER PLUS FLO-VU MEDIUM) MISC 1 each by Other route once for 1 dose. 1 each Hall-Potvin, [place], PA-C     PDMP not reviewed this encounter.   Hall-Potvin, [place], [place] 01/03/20 1045

## 2020-01-03 NOTE — ED Triage Notes (Signed)
Cough and SOB on exertion since Monday

## 2020-01-03 NOTE — Discharge Instructions (Signed)
Your COVID test is pending - it is important to quarantine / isolate at home until your results are back. °If you test positive and would like further evaluation for persistent or worsening symptoms, you may schedule an E-visit or virtual (video) visit throughout the [facility] My[facility] app or website. ° °PLEASE NOTE: If you develop severe chest pain or shortness of breath please go to the ER or call [phone] for further evaluation --> DO NOT schedule electronic or virtual visits for this. °Please call our office for further guidance / recommendations as needed. ° °For information about the Covid vaccine, please visit [email or web address] °

## 2020-01-04 LAB — SARS-COV-2, NAA 2 DAY TAT

## 2020-01-04 LAB — NOVEL CORONAVIRUS, NAA: SARS-CoV-2, NAA: NOT DETECTED

## 2020-12-20 ENCOUNTER — Ambulatory Visit: Payer: Self-pay

## 2021-04-05 ENCOUNTER — Ambulatory Visit: Payer: Self-pay

## 2021-07-05 IMAGING — DX DG CHEST 2V
2 series · 2 of 2 positions shown · non-contrast
Comparison: None.

CLINICAL DATA: Cough.

EXAM:
CHEST - 2 VIEW

[chest pa]
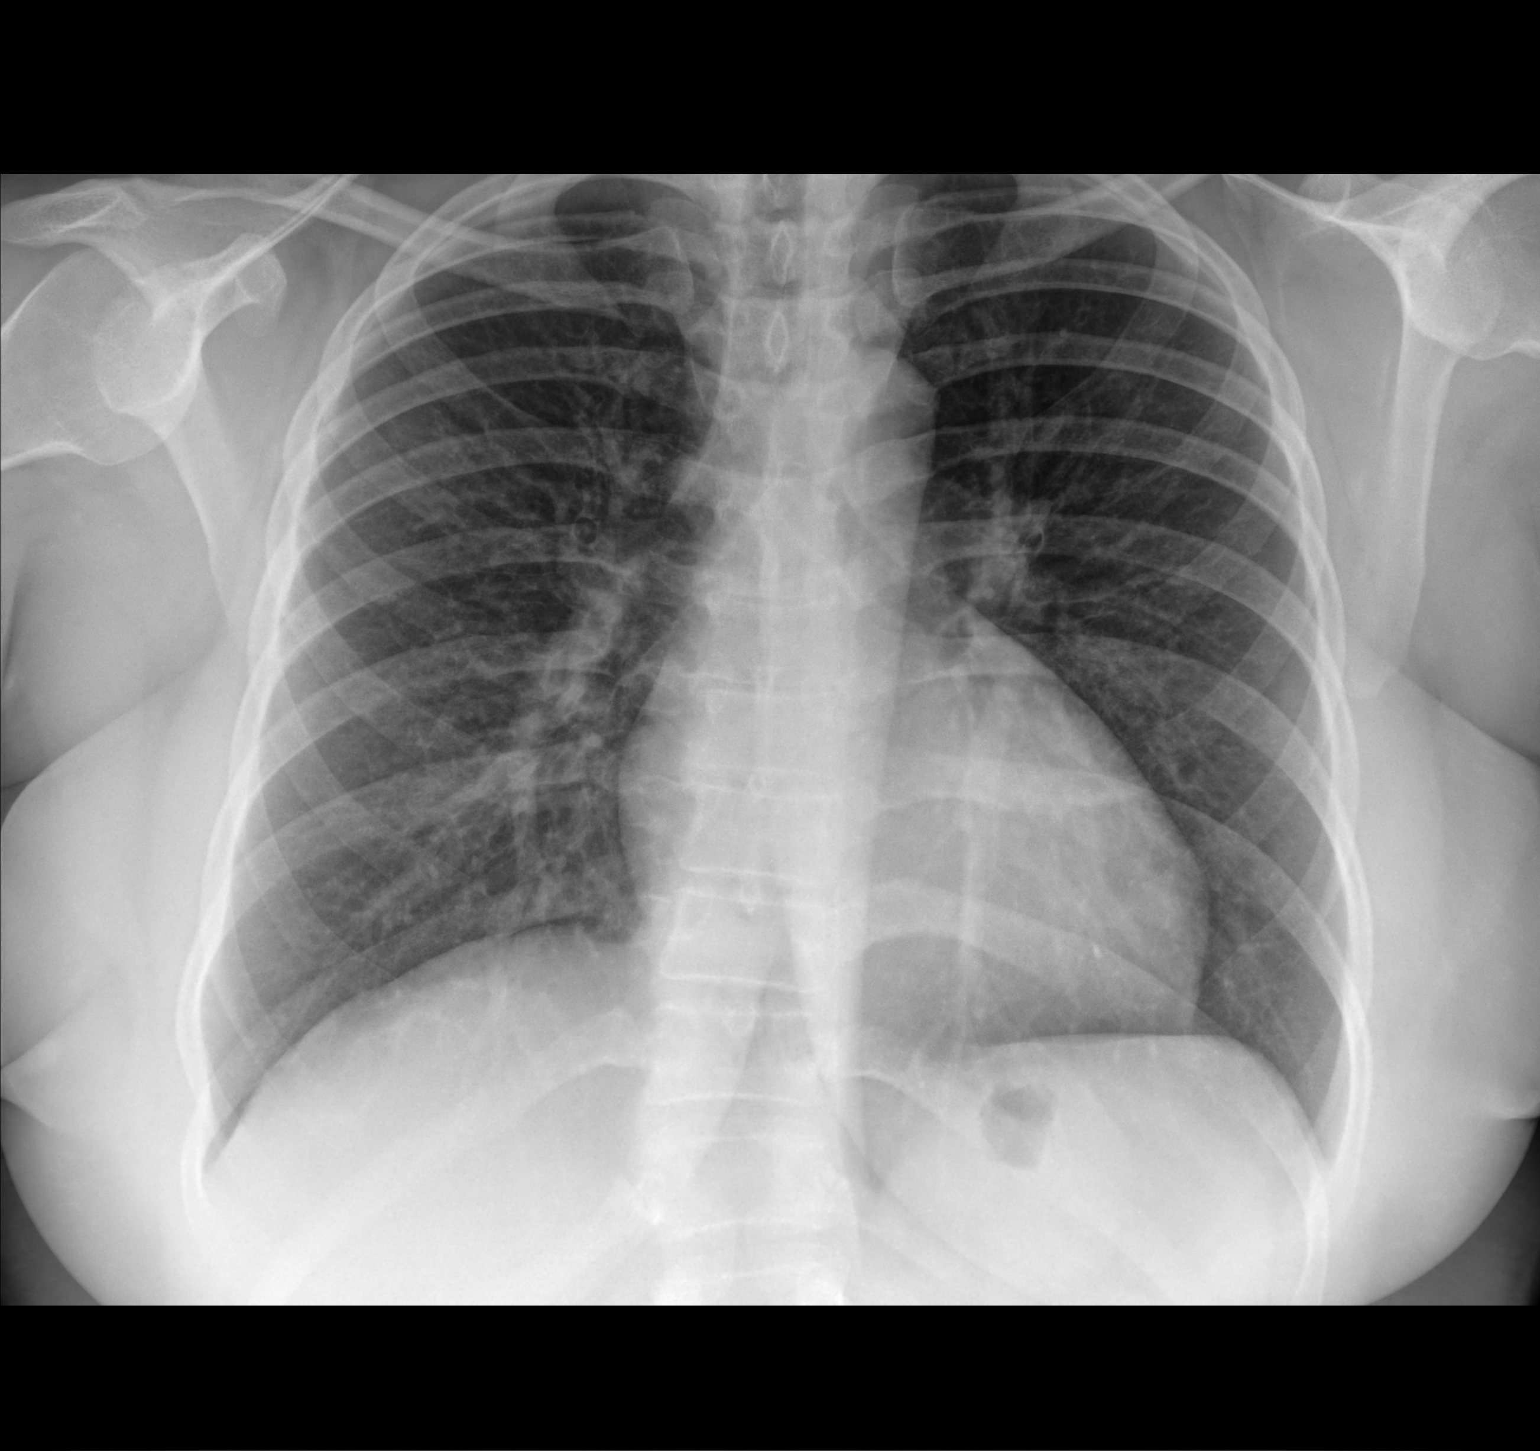

[chest lat]
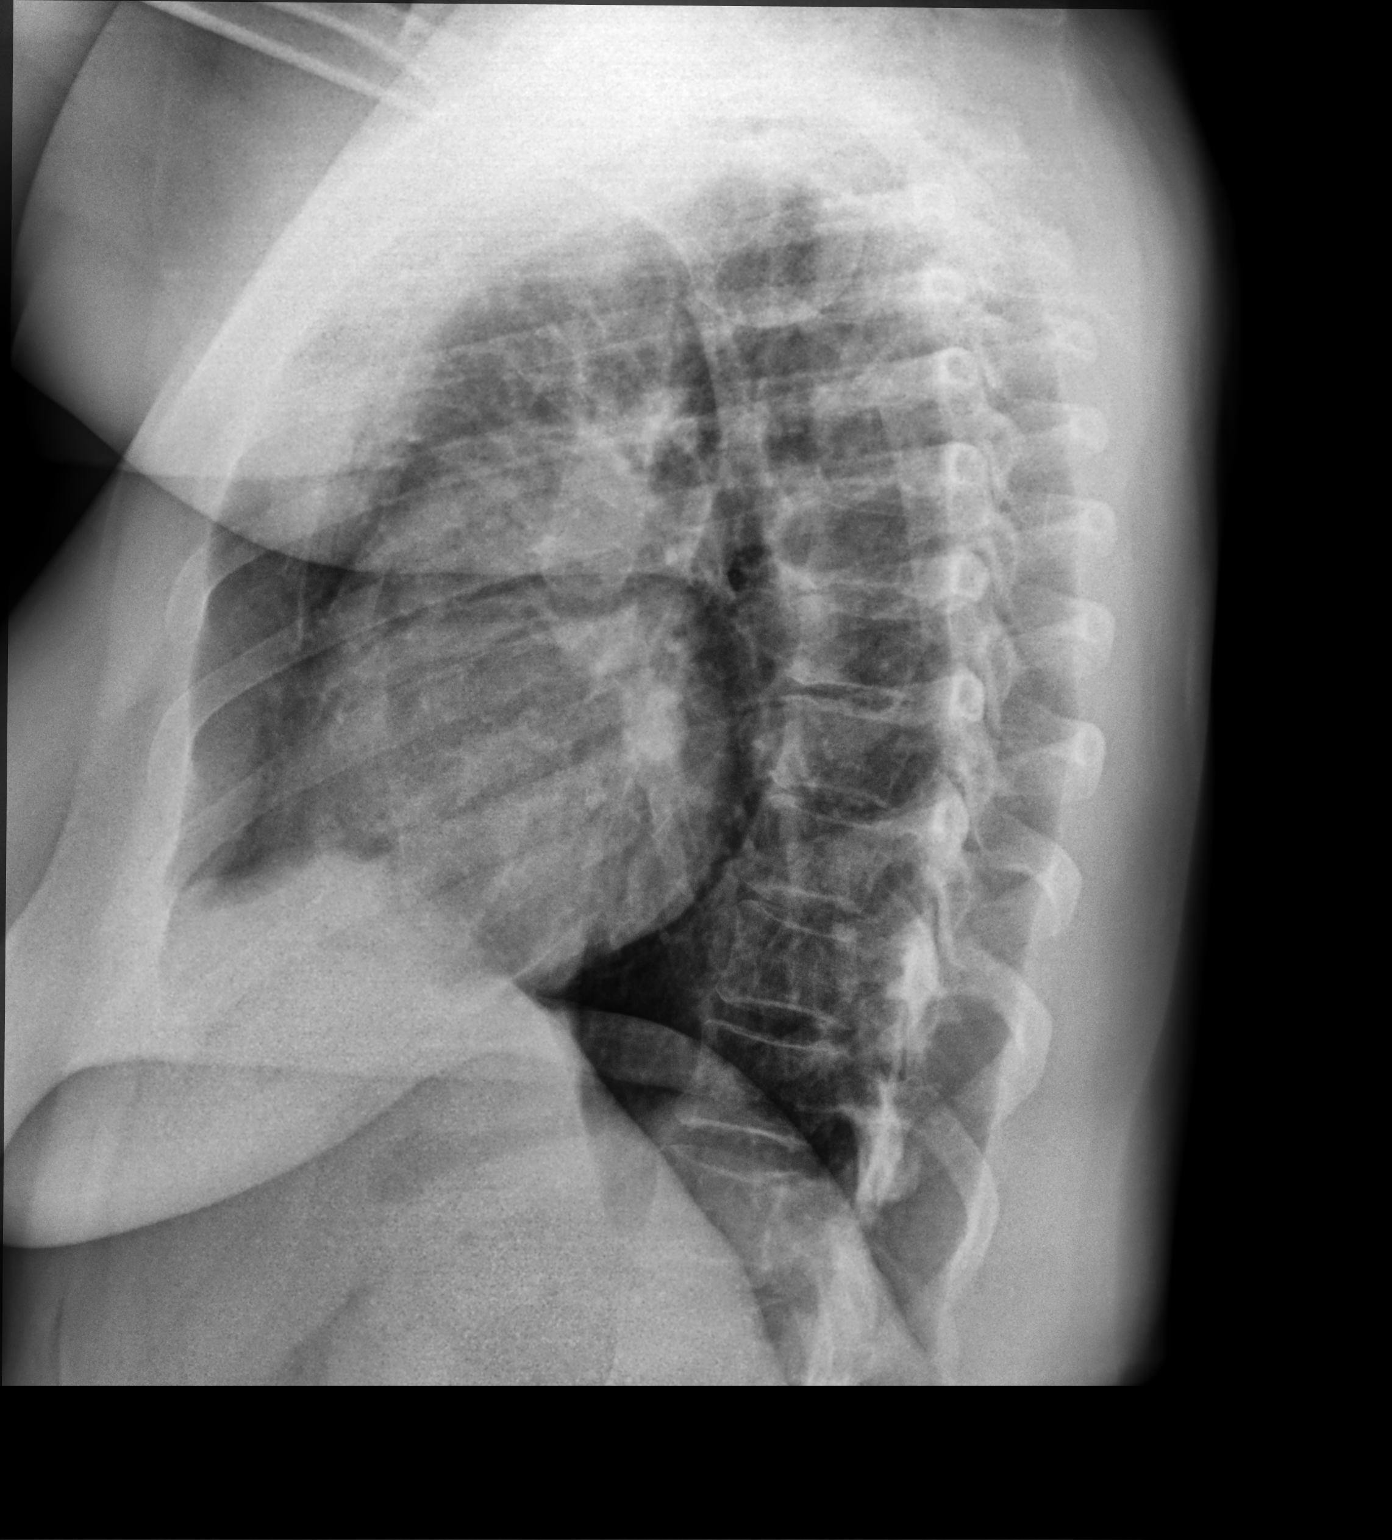

[2 of 2 positions shown; findings below may reference images not displayed]

FINDINGS: The heart size and mediastinal contours are within normal limits.
Both lungs are clear. The visualized skeletal structures are
unremarkable.
IMPRESSION: No active cardiopulmonary disease.
